# Patient Record
Sex: Female | Born: 1980 | Hispanic: No | Marital: Married | Smoking: Never smoker
Health system: Southern US, Community
[De-identification: ages and names within clinical notes are randomized; demographics above are authoritative.]

## PROBLEM LIST (undated history)

## (undated) DIAGNOSIS — D649 Anemia, unspecified: Secondary | ICD-10-CM

## (undated) HISTORY — DX: Anemia, unspecified: D64.9

## (undated) HISTORY — PX: NO PAST SURGERIES: SHX2092

---

## 2013-05-19 ENCOUNTER — Other Ambulatory Visit (HOSPITAL_COMMUNITY): Payer: Self-pay | Admitting: Physician Assistant

## 2013-05-19 DIAGNOSIS — Z3689 Encounter for other specified antenatal screening: Secondary | ICD-10-CM

## 2013-05-19 LAB — OB RESULTS CONSOLE HIV ANTIBODY (ROUTINE TESTING): HIV: NONREACTIVE

## 2013-05-19 LAB — OB RESULTS CONSOLE GC/CHLAMYDIA
CHLAMYDIA, DNA PROBE: NEGATIVE
Gonorrhea: NEGATIVE

## 2013-05-19 LAB — OB RESULTS CONSOLE RUBELLA ANTIBODY, IGM: RUBELLA: IMMUNE

## 2013-05-19 LAB — OB RESULTS CONSOLE ABO/RH: RH Type: POSITIVE

## 2013-05-19 LAB — OB RESULTS CONSOLE RPR: RPR: NONREACTIVE

## 2013-05-19 LAB — OB RESULTS CONSOLE HEPATITIS B SURFACE ANTIGEN: Hepatitis B Surface Ag: NEGATIVE

## 2013-05-19 LAB — OB RESULTS CONSOLE ANTIBODY SCREEN: Antibody Screen: NEGATIVE

## 2013-05-23 ENCOUNTER — Other Ambulatory Visit (HOSPITAL_COMMUNITY): Payer: Self-pay | Admitting: Physician Assistant

## 2013-05-23 ENCOUNTER — Ambulatory Visit (HOSPITAL_COMMUNITY)
Admission: RE | Admit: 2013-05-23 | Discharge: 2013-05-23 | Disposition: A | Discharge status: Home / Self Care | Payer: Medicaid Other | Source: Ambulatory Visit | Attending: Physician Assistant | Admitting: Physician Assistant

## 2013-05-23 ENCOUNTER — Encounter (HOSPITAL_COMMUNITY): Payer: Self-pay

## 2013-05-23 DIAGNOSIS — Z3689 Encounter for other specified antenatal screening: Secondary | ICD-10-CM

## 2013-07-24 NOTE — L&D Delivery Note (Signed)
Delivery Note At 5:56 AM a viable female was delivered via  (Presentation:OT ; ROA ).  APGAR: , ; weight .   Placenta status: heart shaped with accessory lobe, spontaneous, intact.  Cord: loose nuchal with the following complications: none .  Cord pH: not sent  Anesthesia:  Epidural Episiotomy: N/A Lacerations: 1st degree perineal Suture Repair: N/A Est. Blood Loss (mL): 400  Mom to postpartum.  Baby to Couplet care / Skin to Skin.  Myra RudeYacine Sheller was admitted for SOL.  She entered active labor without augmentation, and after cervix went complete and the head presented, followed by anterior shoulder and body. Nuchal cord reduced x1. Crying baby placed on mother's chest. Cord clamped x2 and cut by FOB. 1st degree laceration noted, hemostatic without repair. Uterine bleeding minimal and counts correct x2. [place] Katrinka BlazingSmith was present and supervised entire delivery. Continue routine post-partum care.   [name], Ginny Loomer C 10/16/2013, 6:22 AM

## 2013-07-24 NOTE — L&D Delivery Note (Signed)
I was present for the exam and agree with above.  [place] Had[place] Min[place], [place] 10/16/2013 8:32 AM

## 2013-09-18 LAB — OB RESULTS CONSOLE GBS: STREP GROUP B AG: POSITIVE

## 2013-10-14 ENCOUNTER — Other Ambulatory Visit (HOSPITAL_COMMUNITY): Payer: Self-pay | Admitting: Nurse Practitioner

## 2013-10-14 DIAGNOSIS — O480 Post-term pregnancy: Secondary | ICD-10-CM

## 2013-10-15 ENCOUNTER — Inpatient Hospital Stay (HOSPITAL_COMMUNITY)
Admission: AD | Admit: 2013-10-15 | Discharge: 2013-10-18 | DRG: 775 | Disposition: A | Discharge status: Home / Self Care | Payer: Medicaid Other | Source: Ambulatory Visit | Attending: Obstetrics & Gynecology | Admitting: Obstetrics & Gynecology

## 2013-10-15 ENCOUNTER — Other Ambulatory Visit (HOSPITAL_COMMUNITY): Payer: Self-pay

## 2013-10-15 ENCOUNTER — Encounter (HOSPITAL_COMMUNITY): Payer: Self-pay | Admitting: *Deleted

## 2013-10-15 ENCOUNTER — Telehealth (HOSPITAL_COMMUNITY): Payer: Self-pay | Admitting: *Deleted

## 2013-10-15 DIAGNOSIS — Z22330 Carrier of Group B streptococcus: Secondary | ICD-10-CM

## 2013-10-15 DIAGNOSIS — IMO0001 Reserved for inherently not codable concepts without codable children: Secondary | ICD-10-CM

## 2013-10-15 DIAGNOSIS — O9989 Other specified diseases and conditions complicating pregnancy, childbirth and the puerperium: Secondary | ICD-10-CM

## 2013-10-15 DIAGNOSIS — O99892 Other specified diseases and conditions complicating childbirth: Secondary | ICD-10-CM | POA: Diagnosis present

## 2013-10-15 MED ORDER — FENTANYL CITRATE 0.05 MG/ML IJ SOLN
100.0000 ug | INTRAMUSCULAR | Status: AC | PRN
  Administered 2013-10-16: 100 ug via INTRAVENOUS
  Filled 2013-10-15: qty 2

## 2013-10-15 NOTE — MAU Note (Signed)
Contractions  

## 2013-10-15 NOTE — Telephone Encounter (Signed)
Preadmission screen  

## 2013-10-16 ENCOUNTER — Inpatient Hospital Stay (HOSPITAL_COMMUNITY): Payer: Medicaid Other | Admitting: Anesthesiology

## 2013-10-16 ENCOUNTER — Encounter (HOSPITAL_COMMUNITY): Payer: Self-pay | Admitting: Obstetrics

## 2013-10-16 ENCOUNTER — Encounter (HOSPITAL_COMMUNITY): Payer: Medicaid Other | Admitting: Anesthesiology

## 2013-10-16 DIAGNOSIS — O99892 Other specified diseases and conditions complicating childbirth: Secondary | ICD-10-CM

## 2013-10-16 DIAGNOSIS — IMO0001 Reserved for inherently not codable concepts without codable children: Secondary | ICD-10-CM

## 2013-10-16 DIAGNOSIS — O9989 Other specified diseases and conditions complicating pregnancy, childbirth and the puerperium: Secondary | ICD-10-CM

## 2013-10-16 LAB — CBC
HCT: 39.4 % (ref 36.0–46.0)
Hemoglobin: 13.4 g/dL (ref 12.0–15.0)
MCH: 30.7 pg (ref 26.0–34.0)
MCHC: 34 g/dL (ref 30.0–36.0)
MCV: 90.2 fL (ref 78.0–100.0)
PLATELETS: 252 10*3/uL (ref 150–400)
RBC: 4.37 MIL/uL (ref 3.87–5.11)
RDW: 13.6 % (ref 11.5–15.5)
WBC: 10.4 10*3/uL (ref 4.0–10.5)

## 2013-10-16 LAB — RPR: RPR Ser Ql: NONREACTIVE

## 2013-10-16 MED ORDER — SENNOSIDES-DOCUSATE SODIUM 8.6-50 MG PO TABS
2.0000 | ORAL_TABLET | ORAL | Status: DC
  Administered 2013-10-16: 2 via ORAL
  Filled 2013-10-16 (×2): qty 2

## 2013-10-16 MED ORDER — ONDANSETRON HCL 4 MG/2ML IJ SOLN: 4.0000 mg | Freq: Four times a day (QID) | INTRAMUSCULAR | Status: DC | PRN

## 2013-10-16 MED ORDER — LACTATED RINGERS IV SOLN: 500.0000 mL | INTRAVENOUS | Status: DC | PRN

## 2013-10-16 MED ORDER — PRENATAL MULTIVITAMIN CH
1.0000 | ORAL_TABLET | Freq: Every day | ORAL | Status: DC
  Administered 2013-10-16 – 2013-10-18 (×3): 1 via ORAL
  Filled 2013-10-16 (×3): qty 1

## 2013-10-16 MED ORDER — DIPHENHYDRAMINE HCL 25 MG PO CAPS: 25.0000 mg | ORAL_CAPSULE | Freq: Four times a day (QID) | ORAL | Status: DC | PRN

## 2013-10-16 MED ORDER — LANOLIN HYDROUS EX OINT: TOPICAL_OINTMENT | CUTANEOUS | Status: DC | PRN

## 2013-10-16 MED ORDER — PHENYLEPHRINE 40 MCG/ML (10ML) SYRINGE FOR IV PUSH (FOR BLOOD PRESSURE SUPPORT)
80.0000 ug | PREFILLED_SYRINGE | INTRAVENOUS | Status: DC | PRN
  Filled 2013-10-16: qty 2

## 2013-10-16 MED ORDER — DIPHENHYDRAMINE HCL 50 MG/ML IJ SOLN: 12.5000 mg | INTRAMUSCULAR | Status: DC | PRN

## 2013-10-16 MED ORDER — ACETAMINOPHEN 325 MG PO TABS: 650.0000 mg | ORAL_TABLET | ORAL | Status: DC | PRN

## 2013-10-16 MED ORDER — EPHEDRINE 5 MG/ML INJ
10.0000 mg | INTRAVENOUS | Status: DC | PRN
  Filled 2013-10-16: qty 2
  Filled 2013-10-16: qty 4

## 2013-10-16 MED ORDER — IBUPROFEN 600 MG PO TABS
600.0000 mg | ORAL_TABLET | Freq: Four times a day (QID) | ORAL | Status: DC
  Administered 2013-10-16 – 2013-10-18 (×9): 600 mg via ORAL
  Filled 2013-10-16 (×9): qty 1

## 2013-10-16 MED ORDER — OXYTOCIN BOLUS FROM INFUSION
500.0000 mL | INTRAVENOUS | Status: DC
  Administered 2013-10-16: 500 mL via INTRAVENOUS

## 2013-10-16 MED ORDER — IBUPROFEN 600 MG PO TABS: 600.0000 mg | ORAL_TABLET | Freq: Four times a day (QID) | ORAL | Status: DC | PRN

## 2013-10-16 MED ORDER — OXYCODONE-ACETAMINOPHEN 5-325 MG PO TABS
1.0000 | ORAL_TABLET | ORAL | Status: DC | PRN
  Administered 2013-10-16: 1 via ORAL

## 2013-10-16 MED ORDER — PHENYLEPHRINE 40 MCG/ML (10ML) SYRINGE FOR IV PUSH (FOR BLOOD PRESSURE SUPPORT)
80.0000 ug | PREFILLED_SYRINGE | INTRAVENOUS | Status: DC | PRN
  Filled 2013-10-16: qty 2
  Filled 2013-10-16: qty 10

## 2013-10-16 MED ORDER — FENTANYL 2.5 MCG/ML BUPIVACAINE 1/10 % EPIDURAL INFUSION (WH - ANES)
14.0000 mL/h | INTRAMUSCULAR | Status: DC | PRN
  Administered 2013-10-16: 14 mL/h via EPIDURAL
  Filled 2013-10-16: qty 125

## 2013-10-16 MED ORDER — SIMETHICONE 80 MG PO CHEW: 80.0000 mg | CHEWABLE_TABLET | ORAL | Status: DC | PRN

## 2013-10-16 MED ORDER — LIDOCAINE HCL (PF) 1 % IJ SOLN
30.0000 mL | INTRAMUSCULAR | Status: DC | PRN
  Filled 2013-10-16: qty 30

## 2013-10-16 MED ORDER — OXYCODONE-ACETAMINOPHEN 5-325 MG PO TABS: 1.0000 | ORAL_TABLET | ORAL | Status: DC | PRN

## 2013-10-16 MED ORDER — FENTANYL 2.5 MCG/ML BUPIVACAINE 1/10 % EPIDURAL INFUSION (WH - ANES)
INTRAMUSCULAR | Status: DC | PRN
  Administered 2013-10-16: 14 mL/h via EPIDURAL

## 2013-10-16 MED ORDER — BENZOCAINE-MENTHOL 20-0.5 % EX AERO: 1.0000 "application " | INHALATION_SPRAY | CUTANEOUS | Status: DC | PRN

## 2013-10-16 MED ORDER — OXYTOCIN 40 UNITS IN LACTATED RINGERS INFUSION - SIMPLE MED
62.5000 mL/h | INTRAVENOUS | Status: DC
  Filled 2013-10-16: qty 1000

## 2013-10-16 MED ORDER — ONDANSETRON HCL 4 MG/2ML IJ SOLN: 4.0000 mg | INTRAMUSCULAR | Status: DC | PRN

## 2013-10-16 MED ORDER — SODIUM CHLORIDE 0.9 % IV SOLN
2.0000 g | Freq: Once | INTRAVENOUS | Status: DC
  Filled 2013-10-16: qty 2000

## 2013-10-16 MED ORDER — OXYTOCIN 40 UNITS IN LACTATED RINGERS INFUSION - SIMPLE MED: 62.5000 mL/h | INTRAVENOUS | Status: DC

## 2013-10-16 MED ORDER — ZOLPIDEM TARTRATE 5 MG PO TABS: 5.0000 mg | ORAL_TABLET | Freq: Every evening | ORAL | Status: DC | PRN

## 2013-10-16 MED ORDER — SODIUM CHLORIDE 0.9 % IV SOLN
2.0000 g | Freq: Once | INTRAVENOUS | Status: AC
  Administered 2013-10-16: 2 g via INTRAVENOUS
  Filled 2013-10-16: qty 2000

## 2013-10-16 MED ORDER — EPHEDRINE 5 MG/ML INJ
10.0000 mg | INTRAVENOUS | Status: DC | PRN
  Filled 2013-10-16: qty 2

## 2013-10-16 MED ORDER — OXYCODONE-ACETAMINOPHEN 5-325 MG PO TABS
1.0000 | ORAL_TABLET | ORAL | Status: DC | PRN
  Filled 2013-10-16: qty 1

## 2013-10-16 MED ORDER — LACTATED RINGERS IV SOLN
INTRAVENOUS | Status: DC
  Administered 2013-10-16 (×2): via INTRAVENOUS

## 2013-10-16 MED ORDER — LACTATED RINGERS IV SOLN: INTRAVENOUS | Status: DC

## 2013-10-16 MED ORDER — DIBUCAINE 1 % RE OINT: 1.0000 "application " | TOPICAL_OINTMENT | RECTAL | Status: DC | PRN

## 2013-10-16 MED ORDER — OXYTOCIN BOLUS FROM INFUSION: 500.0000 mL | INTRAVENOUS | Status: DC

## 2013-10-16 MED ORDER — TETANUS-DIPHTH-ACELL PERTUSSIS 5-2.5-18.5 LF-MCG/0.5 IM SUSP: 0.5000 mL | Freq: Once | INTRAMUSCULAR | Status: DC

## 2013-10-16 MED ORDER — ONDANSETRON HCL 4 MG PO TABS: 4.0000 mg | ORAL_TABLET | ORAL | Status: DC | PRN

## 2013-10-16 MED ORDER — CITRIC ACID-SODIUM CITRATE 334-500 MG/5ML PO SOLN: 30.0000 mL | ORAL | Status: DC | PRN

## 2013-10-16 MED ORDER — LIDOCAINE HCL (PF) 1 % IJ SOLN
INTRAMUSCULAR | Status: DC | PRN
  Administered 2013-10-16 (×3): 5 mL

## 2013-10-16 MED ORDER — FLEET ENEMA 7-19 GM/118ML RE ENEM: 1.0000 | ENEMA | RECTAL | Status: DC | PRN

## 2013-10-16 MED ORDER — LACTATED RINGERS IV SOLN
500.0000 mL | Freq: Once | INTRAVENOUS | Status: AC
  Administered 2013-10-16: 500 mL via INTRAVENOUS

## 2013-10-16 MED ORDER — WITCH HAZEL-GLYCERIN EX PADS: 1.0000 "application " | MEDICATED_PAD | CUTANEOUS | Status: DC | PRN

## 2013-10-16 NOTE — H&P (Signed)
Nichole Thomas is a 33 y.o. female presenting for SOL, checked yesterday and was "closed." Painful, regular contractions today, but no LOF or VB. +FM.   History 40.6 by 20 wk u/s. 1st delivery SVD 7lb7oz @ 40 weeks.  OB History   Grav Para Term Preterm Abortions TAB SAB Ect Mult Living   2 1 1       1      Past Medical History  Diagnosis Date  . Anemia    Past Surgical History  Procedure Laterality Date  . No past surgeries     Family History: family history is negative for Alcohol abuse, Arthritis, Asthma, Birth defects, Cancer, COPD, Depression, Diabetes, Drug abuse, Early death, Hearing loss, Heart disease, Hyperlipidemia, Hypertension, Kidney disease, Learning disabilities, Mental illness, Mental retardation, Miscarriages / Stillbirths, Stroke, Vision loss, and Varicose Veins. Social History:  reports that she has never smoked. She has never used smokeless tobacco. She reports that she does not drink alcohol or use illicit drugs.   Prenatal Transfer Tool  Maternal Diabetes: No Genetic Screening: Normal Maternal Ultrasounds/Referrals: Normal Fetal Ultrasounds or other Referrals:  None Maternal Substance Abuse:  No Significant Maternal Medications:  None Significant Maternal Lab Results:  Lab values include: Group B Strep positive Other Comments:  [facility] Patient  ROS See HPI  Dilation: 7.5 Effacement (%): 90 Station: -2 Exam by:: K Fraley   Blood pressure 103/65, pulse 93, temperature 97.7 F (36.5 C), temperature source Oral, resp. rate 18, height 5\' 7"  (1.702 m), weight 79.379 kg (175 lb), last menstrual period 01/03/2013, SpO2 100.00%. Maternal Exam:  Uterine Assessment: Contraction strength is firm.  Contraction frequency is regular.   Cervix: Cervix evaluated by digital exam.     Fetal Exam Fetal Monitor Review: Mode: ultrasound.   Baseline rate: 130.  Variability: moderate (6-25 bpm).   Pattern: accelerations present and no decelerations.     Fetal State Assessment: Category I - tracings are normal.     Physical Exam  Prenatal labs: ABO, Rh: A/Positive/-- (10/27 0000) Antibody: Negative (10/27 0000) Rubella: Immune (10/27 0000) RPR: Nonreactive (10/27 0000)  HBsAg: Negative (10/27 0000)  HIV: Non-reactive (10/27 0000)  GBS: Positive (02/26 0000)   Assessment/Plan: Nichole RudeYacine Slemmer is a 33 y.o. G2P1001 @ 526w6d presents for spontaneous onset of labor. Cervical changes in MAU admitted to L&D.    #Labor: Expectant management, will attempt to prevent precipitous delivery. EFW @20  wk in 37%. Glucola 116. Expect SVD.  #Pain: Fentanyl PRN #FWB: Category 1 in MAU #ID:  GBS +; ampicillin #Feeding: Breast and bottle #MOC: OCP  [name], Jonathan C 10/16/2013, 2:03 AM  I was present for the exam and agree with above.  FHR category I UC's Q 2-4, strong  [place] Heavyn Yearsley, [place] 10/16/2013 2:09 AM

## 2013-10-16 NOTE — Anesthesia Preprocedure Evaluation (Signed)

## 2013-10-16 NOTE — Progress Notes (Signed)
Nichole Thomas is a 33 y.o. G2P1001 at 3540w6d.  Subjective: Mild pressure w/ UC's  Objective: BP 131/82  Pulse 102  Temp(Src) 97.7 F (36.5 C) (Oral)  Resp 18  Ht 5\' 7"  (1.702 m)  Wt 79.379 kg (175 lb)  BMI 27.40 kg/m2  SpO2 100%  LMP 01/03/2013      FHT:  FHR: 120 bpm, variability: moderate,  accelerations:  Present,  decelerations:  Absent UC:   regular, every 2-4 minutes, strong SVE:   Dilation: 9.5 Effacement (%): 100 Station: 0 Exam by:: Larose KellsK Fraley RN  Labs: Lab Results  Component Value Date   WBC 10.4 10/16/2013   HGB 13.4 10/16/2013   HCT 39.4 10/16/2013   MCV 90.2 10/16/2013   PLT 252 10/16/2013    Assessment / Plan: Spontaneous labor, progressing normally  Labor: Progressing normally Preeclampsia:  NA Fetal Wellbeing:  Category I Pain Control:  Epidural I/D:  n/a Anticipated MOD:  NSVD  Akio Hudnall 10/16/2013, 4:16 AM

## 2013-10-16 NOTE — Anesthesia Postprocedure Evaluation (Signed)
  Anesthesia Post-op Note  Patient: Nichole Thomas  Procedure(s) Performed: * No procedures listed *  Patient Location: PACU and Mother/Baby  Anesthesia Type:Epidural  Level of Consciousness: awake, alert , oriented and patient cooperative  Airway and Oxygen Therapy: Patient Spontanous Breathing  Post-op Pain: mild  Post-op Assessment: Post-op Vital signs reviewed, Patient's Cardiovascular Status Stable, Respiratory Function Stable, Patent Airway, No signs of Nausea or vomiting, Adequate PO intake and Pain level controlled  Post-op Vital Signs: Reviewed and stable  Complications: No apparent anesthesia complications

## 2013-10-16 NOTE — Progress Notes (Signed)
Nichole Thomas is a 33 y.o. G2P1001 at 5929w6d by ultrasound admitted for active labor  Subjective: Pain well controlled. Still feeling ctx.   Objective: BP 103/65  Pulse 93  Temp(Src) 97.7 F (36.5 Thomas) (Oral)  Resp 18  Ht 5\' 7"  (1.702 m)  Wt 79.379 kg (175 lb)  BMI 27.40 kg/m2  SpO2 100%  LMP 01/03/2013      FHT:  FHR: 120 bpm, variability: moderate,  accelerations:  Present,  decelerations:  Absent UC:   regular, every 4 minutes SVE:   Dilation: 7.5 Effacement (%): 90 Station: -2 Exam by:: K Fraley   Labs: Lab Results  Component Value Date   WBC 10.4 10/16/2013   HGB 13.4 10/16/2013   HCT 39.4 10/16/2013   MCV 90.2 10/16/2013   PLT 252 10/16/2013    Assessment / Plan: Spontaneous labor, progressing normally  Labor: Progressing normally Ctx have slowed since receiving pain meds; making cervical change Preeclampsia:  no signs or symptoms of toxicity Fetal Wellbeing:  Category I Pain Control:  Epidural I/D:  GBS positive; received ampicillin Anticipated MOD:  NSVD  Nichole Thomas, Nichole Thomas 10/16/2013, 2:05 AM

## 2013-10-16 NOTE — Anesthesia Procedure Notes (Signed)
Epidural Patient location during procedure: OB  Staffing Anesthesiologist: Laderrick Wilk Performed by: anesthesiologist   Preanesthetic Checklist Completed: patient identified, site marked, surgical consent, pre-op evaluation, timeout performed, IV checked, risks and benefits discussed and monitors and equipment checked  Epidural Patient position: sitting Prep: ChloraPrep Patient monitoring: heart rate, continuous pulse ox and blood pressure Approach: right paramedian Location: L2-L3 Injection technique: LOR saline  Needle:  Needle type: Tuohy  Needle gauge: 17 G Needle length: 9 cm and 9 Needle insertion depth: 6 cm Catheter type: closed end flexible Catheter size: 20 Guage Catheter at skin depth: 11 cm Test dose: negative  Assessment Events: blood not aspirated, injection not painful, no injection resistance, negative IV test and no paresthesia  Additional Notes   Patient tolerated the insertion well without complications.   

## 2013-10-16 NOTE — Lactation Note (Signed)
This note was copied from the chart of Boy Leonor Holts. Lactation Consultation Note: Initial visit with this baby now 6 hours old. Mom reports that he did not nurse after delivery. She has him skin to skin. Ped in examining baby. He was fussy so we attempted to latch him on. As soon as he got next to her he went to sleep and would not latch.Mom easily able to hand express Colostrum. Spoon fed about 3 cc's to baby to see if he would awaken and latch. Still sleepy- placed skin to skin. Reviewed feeding cues with mom and encouraged to feed whenever she sees them. Mom reports that she plans to breast and formula feed. Encouraged to nurse frequently to promote a good milk supply. No questions at present. To call for assist prn.  Patient Name: Boy Myra RudeYacine Sawyer ZOXWR'UToday's Date: 10/16/2013 Reason for consult: Initial assessment   Maternal Data Formula Feeding for Exclusion: Yes Reason for exclusion: Mother's choice to formula and breast feed on admission Has patient been taught Hand Expression?: Yes Does the patient have breastfeeding experience prior to this delivery?: Yes  Feeding Feeding Type: Breast Fed  LATCH Score/Interventions Latch: Too sleepy or reluctant, no latch achieved, no sucking elicited.  Audible Swallowing: None  Type of Nipple: Everted at rest and after stimulation  Comfort (Breast/Nipple): Soft / non-tender     Hold (Positioning): Assistance needed to correctly position infant at breast and maintain latch. Intervention(s): Breastfeeding basics reviewed;Skin to skin;Support Pillows  LATCH Score: 5  Lactation Tools Discussed/Used     Consult Status Consult Status: Follow-up Date: 10/17/13 Follow-up type: In-patient    Pamelia HoitWeeks, Gable Odonohue D 10/16/2013, 12:16 PM

## 2013-10-17 ENCOUNTER — Ambulatory Visit (HOSPITAL_COMMUNITY): Admission: RE | Admit: 2013-10-17 | Payer: Self-pay | Source: Ambulatory Visit

## 2013-10-17 NOTE — Progress Notes (Signed)
Resident on call notified pt dizzy he stated pt ebl after delivery was 300 and she was not anemic prior to delivery instructed rn to have pt drink liter of fluid repeat b/p in couple of hours.

## 2013-10-17 NOTE — Progress Notes (Signed)
Post Partum Day 1  Subjective: no complaints, up ad lib, voiding, tolerating PO and + flatus  Objective: Blood pressure 95/64, pulse 85, temperature 98.5 F (36.9 C), temperature source Oral, resp. rate 16, height 5\' 7"  (1.702 m), weight 79.379 kg (175 lb), last menstrual period 01/03/2013, SpO2 100.00%, unknown if currently breastfeeding.  Physical Exam:  General: alert, cooperative and no distress Lochia: appropriate Uterine Fundus: firm Incision: N/A  DVT Evaluation: No evidence of DVT seen on physical exam. Negative Homan's sign. No cords or calf tenderness. No significant calf/ankle edema.   Recent Labs  10/16/13 0005  HGB 13.4  HCT 39.4    Assessment/Plan: Plan for discharge tomorrow and Social Work consult for transportation issues if son remains in NICU   LOS: 2 days   [name], Jonathan C 10/17/2013, 9:30 AM   I have seen and examined this patient and agree with above documentation in the resident's note. Baby in NICU due to hypothermia and tachypnea. Being monitored.    Rulon AbideKeli Gustavia Carie, M.D. [facility] 10/17/2013 10:41 AM

## 2013-10-17 NOTE — Lactation Note (Addendum)
This note was copied from the chart of Nichole Thomas. Lactation Consultation Note   Follow up  consult with this mom of a NICU baby, now 30 hours post partum, and in the NICU for persistent tachypnea and low temperatures. Mom has begun pumping with a DEP, and I reviewed the importance of pumping , in premie setting, every 3 hours, 8 times a day. Mom is also breast feeding her baby in the nICU. She was able to express 2-3 mls of colostrum with DEP, while I was with her, and I showed her how to add hand expression. Mom is active with WIC, and will be discharged to home tomorrow. [facility] for them to set up a time for an appointment to pick up a pump. I will follow this family in the niCU.  Patient Name: Nichole Thomas [id number]'XToday's Date: 10/17/2013 Reason for consult: Initial assessment;NICU baby   Maternal Data Formula Feeding for Exclusion: Yes (baby in NICU) Has patient been taught Hand Expression?: Yes Does the patient have breastfeeding experience prior to this delivery?: Yes  Feeding Feeding Type: Formula Length of feed: 30 min  LATCH Score/Interventions Latch: Repeated attempts needed to sustain latch, nipple held in mouth throughout feeding, stimulation needed to elicit sucking reflex. Intervention(s): Skin to skin Intervention(s): Assist with latch;Adjust position              Intervention(s): Skin to skin;Support Pillows     Lactation Tools Discussed/Used Tools: Pump Breast pump type: Double-Electric Breast Pump WIC Program: Yes ([facility]) Pump Review: Setup, frequency, and cleaning;Milk Storage;Other (comment) (premie setting, hand expression, and review of book on ow to provide eBm fr your baby) Initiated by:: bedside RN Date initiated:: 10/17/13   Consult Status Consult Status: Follow-up Date: 10/18/13 Follow-up type: In-patient    Alfred LevinsLee, Karl Erway Anne 10/17/2013, 2:10 PM

## 2013-10-17 NOTE — Progress Notes (Signed)
Pt states she is no longer dizzy like at 1530. Her B/P was lower at 1800 than at 1530. She is eating normally- good amounts.

## 2013-10-17 NOTE — Progress Notes (Signed)
Clinical Social Work Department PSYCHOSOCIAL ASSESSMENT - MATERNAL/CHILD 10/17/2013  Patient:  Nichole Thomas,Nichole Thomas  Account Number:  [id number]  Admit Date:  10/15/2013  Ardine Eng Name:   Merrilyn Puma    Clinical Social Worker:  Terri Piedra, LCSW   Date/Time:  10/17/2013 10:00 AM  Date Referred:  10/17/2013   Referral source  Physician     Referred reason  Transportation assistance  NICU   Other referral source:    I:  FAMILY / HOME ENVIRONMENT Child's legal guardian:  PARENT  Guardian - Name Guardian - Age Guardian - Address  Nichole Thomas [address], [place], [place] [address]  Nichole Thomas  same   Other household support members/support persons Name Relationship DOB   SON 2   Other support:   MOB states her husband's cousin is a support person here in [place] and is currently taking care of their 33 year old while MOB is in the hospital and FOB is at work.    II  PSYCHOSOCIAL DATA Information Source:  Patient Interview  Occupational hygienist Employment:   FOB works for [employer] resources:   If Medicaid - County:    School / Grade:   Maternity Care Coordinator / Child Services Coordination / Early Interventions:  Cultural issues impacting care:   MOB is originally from [place] and has lived in the [place] for 9 months.  She speaks [place], however, there is a definite language barrier at times.  Her primary language is [place].    III  STRENGTHS Strengths  Adequate Resources  Compliance with medical plan  Home prepared for Child (including basic supplies)  Other - See comment  Supportive family/friends  Understanding of illness   Strength comment:  Baby will go to [facility] for pediatric follow up.   IV  RISK FACTORS AND CURRENT PROBLEMS Current Problem:  None   Risk Factor & Current Problem Patient Issue Family Issue Risk Factor / Current Problem Comment         V  SOCIAL WORK ASSESSMENT  CSW met with MOB in her first  floor room/122 to introduce myself and complete assessment due to baby's admission to NICU and consult for "transportation needs."  MOB was very pleasant and welcoming of CSW's visit.  She states she is doing well and has no questions or needs at this time.  She states she speaks [place], but asked CSW to speak clearly and slowly.  She states her primary language is [place].  CSW informed her that she can request a [place] interpreter at any time if she wishes.  She stated understanding.  She reports having everything she needs for baby at home and a good, however limited, support system.  CSW asked her about transportation in order to visit the baby after her discharge and she states she can come up when her husband gets off work at 5 each day (Monday-Friday), but will not be able to come every three hours to breastfeed the baby.  CSW explained that we do not expect any NICU mother to be able to do this and asked if she has [facility] or a pump at home.  She states she has [place].  CSW explained that she can get a pump from [facility] and bring her pumped milk to the NICU each time she is able to come see her baby.  She reports FOB has a car and they will be able to come once per day on week days.  Therefore,  MOB does not have transportation needs.  CSW did offer bus passes, but MOB thankfully declined this resource.  MOB states no questions about baby's current medical needs.  She states she may choose to stay her second night in the hospital any way to be close to the baby, instead of being an early discharge today.  CSW told her to let her RN know what she decides.  CSW discussed signs and symptoms of PPD to watch for and asked her to let CSW or her doctor know if she has emotional concerns at any time.  She agreed and denies PPD symptoms after her first child.  CSW is not aware of any social concerns at this time and identifies no barriers to discharge.  CSW explained ongoing support services offered by NICU CSW and gave MOB  CSW's contact information.   VI SOCIAL WORK PLAN Social Work Plan  Psychosocial Support/Ongoing Assessment of Needs  Patient/Family Education   Type of pt/family education:   Ongoing support services offered by NICU CSW  PPD signs and symptoms   If child protective services report - county:   If child protective services report - date:   Information/referral to community resources comment:   No referral needs identified at this time.   Other social work plan:

## 2013-10-17 NOTE — Lactation Note (Signed)
This note was copied from the chart of Nichole Thomas. Lactation Consultation Note  Patient Name: Nichole Thomas [id number] Date: 10/17/2013 Reason for consult: Follow-up assessment;Other (Comment) (update of exclusion due to baby's transfer to NICU) Mom is almost asleep and has just returned from a visit to baby in NICU.  Mom has hand pump in room but LC recommends mom use DEBP.  LC placed symphony pump and kit at bedside and asked RN to assist her with use of pump later tonight.   Maternal Data Reason for exclusion: Admission to Intensive Care Unit (ICU) post-partum  Feeding Feeding Type: Breast Milk with Formula added Length of feed: 30 min  LATCH Score/Interventions              baby in NICU        Lactation Tools Discussed/Used Tools: Pump Pump Review:  (pump placed in room but mom resting after visit to NICU; RN to assist later) Date initiated:: 10/17/13   Consult Status Consult Status: Follow-up Date: 10/17/13 Follow-up type: In-patient    Junious Dresser [facility] 10/17/2013, 2:56 AM

## 2013-10-17 NOTE — Progress Notes (Signed)
UR chart review completed.  

## 2013-10-17 NOTE — Progress Notes (Signed)
Mother using electric pump and taking milk to NICU

## 2013-10-17 NOTE — Progress Notes (Signed)
Mother was offerred early discharge by [facility] tonight and she wants to go. She will need to get [facility] loaner pump in order to keep breast milk available for infant. There is paper work for this that mother may need help filling out tomorrow. I instructed her in taking the pump kit home in order to use the motor.

## 2013-10-18 MED ORDER — DOCUSATE SODIUM 100 MG PO CAPS: 100.0000 mg | ORAL_CAPSULE | Freq: Two times a day (BID) | ORAL | Status: AC

## 2013-10-18 MED ORDER — IBUPROFEN 600 MG PO TABS: 600.0000 mg | ORAL_TABLET | Freq: Four times a day (QID) | ORAL | Status: AC | PRN

## 2013-10-18 NOTE — Discharge Summary (Signed)
Obstetric Discharge Summary Reason for Admission: onset of labor Prenatal Procedures: none Intrapartum Procedures: spontaneous vaginal delivery and GBS prophylaxis Postpartum Procedures: none Complications-Operative and Postpartum: 1st degree perineal laceration Hemoglobin  Date Value Ref Range Status  10/16/2013 13.4  12.0 - 15.0 g/dL Final     HCT  Date Value Ref Range Status  10/16/2013 39.4  36.0 - 46.0 % Final    Discharge Diagnoses: Term Pregnancy-delivered  Hospital Course:  Nichole Thomas is a 33 y.o. [id number] who presented with active labor.  She had a uncomplicated SVD. She was able to ambulate, tolerate PO and void normally. She was discharged home with instructions for postpartum care.    Delivery Note  At 5:56 AM a viable female was delivered via (Presentation:OT ; ROA ). APGAR: , ; weight .  Placenta status: heart shaped with accessory lobe, spontaneous, intact. Cord: loose nuchal with the following complications: none . Cord pH: not sent  Anesthesia: Epidural  Episiotomy: N/A  Lacerations: 1st degree perineal  Suture Repair: N/A  Est. Blood Loss (mL): 400  Mom to postpartum. Baby to Couplet care / Skin to Skin.  Nichole Thomas was admitted for SOL. She entered active labor without augmentation, and after cervix went complete and the head presented, followed by anterior shoulder and body. Nuchal cord reduced x1. Crying baby placed on mother's chest. Cord clamped x2 and cut by FOB. 1st degree laceration noted, hemostatic without repair. Uterine bleeding minimal and counts correct x2. Nichole Thomas was present and supervised entire delivery. Continue routine post-partum care    Physical Exam:  General: alert, cooperative and no distress Lochia: appropriate Uterine Fundus: firm DVT Evaluation: No evidence of DVT seen on physical exam. Negative Homan's sign. No cords or calf tenderness. No significant calf/ankle edema.  Discharge Information: Date: 10/18/2013 Activity:  pelvic rest Diet: routine Medications: PNV, Ibuprofen and Colace Baby feeding: plans to breastfeed, plans to bottle feed, pumping while baby in nicu Contraception: Nexplanon, motrin Condition: stable Instructions: refer to practice specific booklet Discharge to: home Follow-up Information   [facility]. (Discusss contraception options)    Specialty:  Home Health Services   Contact information:   Care Coordination for [facility] [address] [place] [id number] [phone]       Newborn Data: Live born female  Birth Weight: 7 lb 7 oz (3374 g) APGAR: 8, 9  Baby to stay in hospital until discharge by pediatrics.  Wenda LowJames Joyner, MD [facility] PGY-1 10/18/2013, 9:34 AM

## 2013-10-18 NOTE — Discharge Summary (Signed)
Attestation of Attending Supervision of Obstetric Fellow: Evaluation and management procedures were performed by the Obstetric Fellow under my supervision and collaboration.  I have reviewed the Obstetric Fellow's note and chart, and I agree with the management and plan.  Aixa Corsello, MD, FACOG Attending Obstetrician & Gynecologist Faculty Practice, [facility]   

## 2013-10-18 NOTE — Discharge Instructions (Signed)
Postpartum Care After Vaginal Delivery °After you deliver your newborn (postpartum period), the usual stay in the hospital is 24 72 hours. If there were problems with your labor or delivery, or if you have other medical problems, you might be in the hospital longer.  °While you are in the hospital, you will receive help and instructions on how to care for yourself and your newborn during the postpartum period.  °While you are in the hospital: °· Be sure to tell your nurses if you have pain or discomfort, as well as where you feel the pain and what makes the pain worse. °· If you had an incision made near your vagina (episiotomy) or if you had some tearing during delivery, the nurses may put ice packs on your episiotomy or tear. The ice packs may help to reduce the pain and swelling. °· If you are breastfeeding, you may feel uncomfortable contractions of your uterus for a couple of weeks. This is normal. The contractions help your uterus get back to normal size. °· It is normal to have some bleeding after delivery. °· For the first 1 3 days after delivery, the flow is red and the amount may be similar to a period. °· It is common for the flow to start and stop. °· In the first few days, you may pass some small clots. Let your nurses know if you begin to pass large clots or your flow increases. °· Do not  flush blood clots down the toilet before having the nurse look at them. °· During the next 3 10 days after delivery, your flow should become more watery and pink or brown-tinged in color. °· Ten to fourteen days after delivery, your flow should be a small amount of yellowish-white discharge. °· The amount of your flow will decrease over the first few weeks after delivery. Your flow may stop in 6 8 weeks. Most women have had their flow stop by 12 weeks after delivery. °· You should change your sanitary pads frequently. °· Wash your hands thoroughly with soap and water for at least 20 seconds after changing pads, using  the toilet, or before holding or feeding your newborn. °· You should feel like you need to empty your bladder within the first 6 8 hours after delivery. °· In case you become weak, lightheaded, or faint, call your nurse before you get out of bed for the first time and before you take a shower for the first time. °· Within the first few days after delivery, your breasts may begin to feel tender and full. This is called engorgement. Breast tenderness usually goes away within 48 72 hours after engorgement occurs. You may also notice milk leaking from your breasts. If you are not breastfeeding, do not stimulate your breasts. Breast stimulation can make your breasts produce more milk. °· Spending as much time as possible with your newborn is very important. During this time, you and your newborn can feel close and get to know each other. Having your newborn stay in your room (rooming in) will help to strengthen the bond with your newborn.  It will give you time to get to know your newborn and become comfortable caring for your newborn. °· Your hormones change after delivery. Sometimes the hormone changes can temporarily cause you to feel sad or tearful. These feelings should not last more than a few days. If these feelings last longer than that, you should talk to your caregiver. °· If desired, talk to your caregiver about   methods of family planning or contraception.  Talk to your caregiver about immunizations. Your caregiver may want you to have the following immunizations before leaving the hospital:  Tetanus, diphtheria, and pertussis (Tdap) or tetanus and diphtheria (Td) immunization. It is very important that you and your family (including grandparents) or others caring for your newborn are up-to-date with the Tdap or Td immunizations. The Tdap or Td immunization can help protect your newborn from getting ill.  Rubella immunization.  Varicella (chickenpox) immunization.  Influenza immunization. You should  receive this annual immunization if you did not receive the immunization during your pregnancy. Document Released: 05/07/2007 Document Revised: 04/03/2012 Document Reviewed: 03/06/2012 [facility] Patient Information 2014 [place], [place].   Etonogestrel implant What is this medicine? ETONOGESTREL (et oh noe JES trel) is a contraceptive (birth control) device. It is used to prevent pregnancy. It can be used for up to 3 years. This medicine may be used for other purposes; ask your health care provider or pharmacist if you have questions. COMMON BRAND NAME(S): Implanon, Nexplanon  What should I tell my health care provider before I take this medicine? They need to know if you have any of these conditions: -abnormal vaginal bleeding -blood vessel disease or blood clots -cancer of the breast, cervix, or liver -depression -diabetes -gallbladder disease -headaches -heart disease or recent heart attack -high blood pressure -high cholesterol -kidney disease -liver disease -renal disease -seizures -tobacco smoker -an unusual or allergic reaction to etonogestrel, other hormones, anesthetics or antiseptics, medicines, foods, dyes, or preservatives -pregnant or trying to get pregnant -breast-feeding How should I use this medicine? This device is inserted just under the skin on the inner side of your upper arm by a health care professional. Talk to your pediatrician regarding the use of this medicine in children. Special care may be needed. Overdosage: If you think you've taken too much of this medicine contact a poison control center or emergency room at once. Overdosage: If you think you have taken too much of this medicine contact a poison control center or emergency room at once. NOTE: This medicine is only for you. Do not share this medicine with others. What if I miss a dose? This does not apply. What may interact with this medicine? Do not take this medicine with any of the following  medications: -amprenavir -bosentan -fosamprenavir This medicine may also interact with the following medications: -barbiturate medicines for inducing sleep or treating seizures -certain medicines for fungal infections like ketoconazole and itraconazole -griseofulvin -medicines to treat seizures like carbamazepine, felbamate, oxcarbazepine, phenytoin, topiramate -modafinil -phenylbutazone -rifampin -some medicines to treat HIV infection like atazanavir, indinavir, lopinavir, nelfinavir, tipranavir, ritonavir -St. John's wort This list may not describe all possible interactions. Give your health care provider a list of all the medicines, herbs, non-prescription drugs, or dietary supplements you use. Also tell them if you smoke, drink alcohol, or use illegal drugs. Some items may interact with your medicine. What should I watch for while using this medicine? This product does not protect you against HIV infection (AIDS) or other sexually transmitted diseases. You should be able to feel the implant by pressing your fingertips over the skin where it was inserted. Tell your doctor if you cannot feel the implant. What side effects may I notice from receiving this medicine? Side effects that you should report to your doctor or health care professional as soon as possible: -allergic reactions like skin rash, itching or hives, swelling of the face, lips, or tongue -breast lumps -changes in  vision -confusion, trouble speaking or understanding -dark urine -depressed mood -general ill feeling or flu-like symptoms -light-colored stools -loss of appetite, nausea -right upper belly pain -severe headaches -severe pain, swelling, or tenderness in the abdomen -shortness of breath, chest pain, swelling in a leg -signs of pregnancy -sudden numbness or weakness of the face, arm or leg -trouble walking, dizziness, loss of balance or coordination -unusual vaginal bleeding, discharge -unusually weak or  tired -yellowing of the eyes or skin Side effects that usually do not require medical attention (Report these to your doctor or health care professional if they continue or are bothersome.): -acne -breast pain -changes in weight -cough -fever or chills -headache -irregular menstrual bleeding -itching, burning, and vaginal discharge -pain or difficulty passing urine -sore throat This list may not describe all possible side effects. Call your doctor for medical advice about side effects. You may report side effects to FDA at 1-800-FDA-1088. Where should I keep my medicine? This drug is given in a hospital or clinic and will not be stored at home. NOTE: This sheet is a summary. It may not cover all possible information. If you have questions about this medicine, talk to your doctor, pharmacist, or health care provider.  2014, [employer] Standard. (2012-01-15 15:37:45) Levonorgestrel intrauterine device (IUD) What is this medicine? LEVONORGESTREL IUD (LEE voe nor jes trel) is a contraceptive (birth control) device. The device is placed inside the uterus by a healthcare professional. It is used to prevent pregnancy and can also be used to treat heavy bleeding that occurs during your period. Depending on the device, it can be used for 3 to 5 years. This medicine may be used for other purposes; ask your health care provider or pharmacist if you have questions. COMMON BRAND NAME(S): Gretta Cool What should I tell my health care provider before I take this medicine? They need to know if you have any of these conditions: -abnormal Pap smear -cancer of the breast, uterus, or cervix -diabetes -endometritis -genital or pelvic infection now or in the past -have more than one sexual partner or your partner has more than one partner -heart disease -history of an ectopic or tubal pregnancy -immune system problems -IUD in place -liver disease or tumor -problems with blood clots or take  blood-thinners -use intravenous drugs -uterus of unusual shape -vaginal bleeding that has not been explained -an unusual or allergic reaction to levonorgestrel, other hormones, silicone, or polyethylene, medicines, foods, dyes, or preservatives -pregnant or trying to get pregnant -breast-feeding How should I use this medicine? This device is placed inside the uterus by a health care professional. Talk to your pediatrician regarding the use of this medicine in children. Special care may be needed. Overdosage: If you think you have taken too much of this medicine contact a poison control center or emergency room at once. NOTE: This medicine is only for you. Do not share this medicine with others. What if I miss a dose? This does not apply. What may interact with this medicine? Do not take this medicine with any of the following medications: -amprenavir -bosentan -fosamprenavir This medicine may also interact with the following medications: -aprepitant -barbiturate medicines for inducing sleep or treating seizures -bexarotene -griseofulvin -medicines to treat seizures like carbamazepine, ethotoin, felbamate, oxcarbazepine, phenytoin, topiramate -modafinil -pioglitazone -rifabutin -rifampin -rifapentine -some medicines to treat HIV infection like atazanavir, indinavir, lopinavir, nelfinavir, tipranavir, ritonavir -St. John's wort -warfarin This list may not describe all possible interactions. Give your health care provider a list of all the  medicines, herbs, non-prescription drugs, or dietary supplements you use. Also tell them if you smoke, drink alcohol, or use illegal drugs. Some items may interact with your medicine. What should I watch for while using this medicine? Visit your doctor or health care professional for regular check ups. See your doctor if you or your partner has sexual contact with others, becomes HIV positive, or gets a sexual transmitted disease. This product does  not protect you against HIV infection (AIDS) or other sexually transmitted diseases. You can check the placement of the IUD yourself by reaching up to the top of your vagina with clean fingers to feel the threads. Do not pull on the threads. It is a good habit to check placement after each menstrual period. Call your doctor right away if you feel more of the IUD than just the threads or if you cannot feel the threads at all. The IUD may come out by itself. You may become pregnant if the device comes out. If you notice that the IUD has come out use a backup birth control method like condoms and call your health care provider. Using tampons will not change the position of the IUD and are okay to use during your period. What side effects may I notice from receiving this medicine? Side effects that you should report to your doctor or health care professional as soon as possible: -allergic reactions like skin rash, itching or hives, swelling of the face, lips, or tongue -fever, flu-like symptoms -genital sores -high blood pressure -no menstrual period for 6 weeks during use -pain, swelling, warmth in the leg -pelvic pain or tenderness -severe or sudden headache -signs of pregnancy -stomach cramping -sudden shortness of breath -trouble with balance, talking, or walking -unusual vaginal bleeding, discharge -yellowing of the eyes or skin Side effects that usually do not require medical attention (report to your doctor or health care professional if they continue or are bothersome): -acne -breast pain -change in sex drive or performance -changes in weight -cramping, dizziness, or faintness while the device is being inserted -headache -irregular menstrual bleeding within first 3 to 6 months of use -nausea This list may not describe all possible side effects. Call your doctor for medical advice about side effects. You may report side effects to FDA at 1-800-FDA-1088. Where should I keep my  medicine? This does not apply. NOTE: This sheet is a summary. It may not cover all possible information. If you have questions about this medicine, talk to your doctor, pharmacist, or health care provider.  2014, [employer]/Gold Standard. (2011-08-10 13:54:04)

## 2013-10-18 NOTE — Progress Notes (Signed)
Pt states she isn't dizzy now but does get dizzy occasionally- unrelated to any event. She stated she was anemic early in her pregnancy.

## 2013-10-20 ENCOUNTER — Inpatient Hospital Stay (HOSPITAL_COMMUNITY): Admission: RE | Admit: 2013-10-20 | Payer: Medicaid Other | Source: Ambulatory Visit

## 2014-05-25 ENCOUNTER — Encounter (HOSPITAL_COMMUNITY): Payer: Self-pay | Admitting: Obstetrics

## 2014-06-22 IMAGING — US US OB COMP +14 WK
1 series · 12 of 28 positions shown · non-contrast
Comparison: none

[Series 1: us ob comp +14 wk · 12 of 93 slices shown]
[im 4/93]
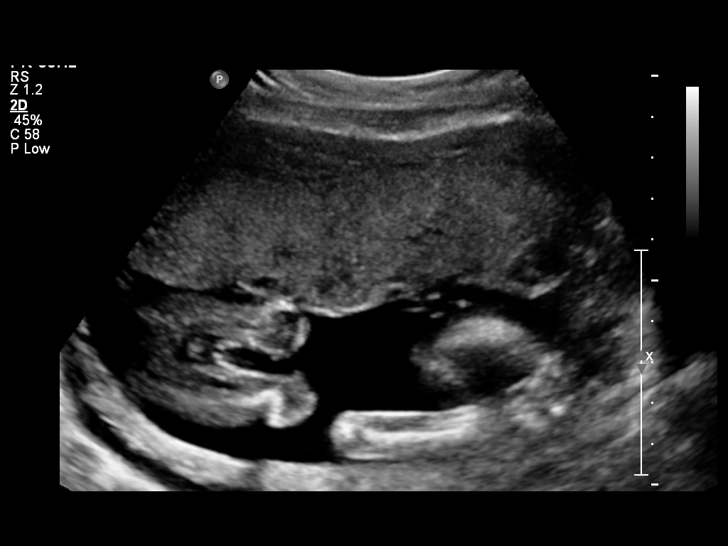
[im 11/93]
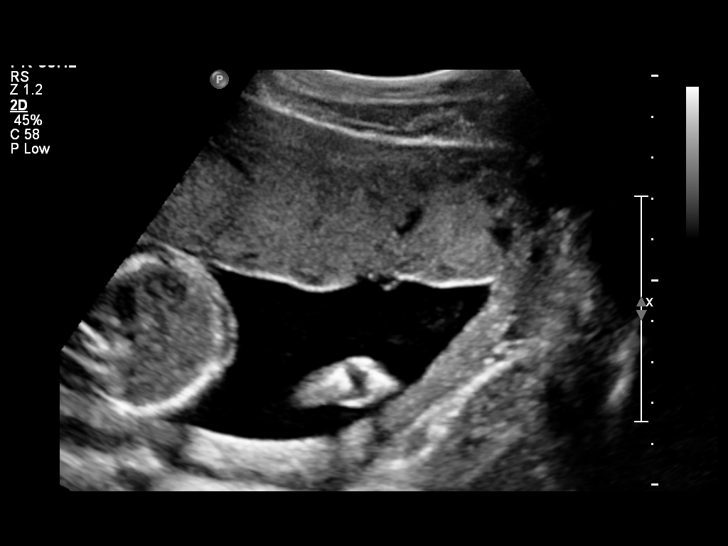
[im 18/93]
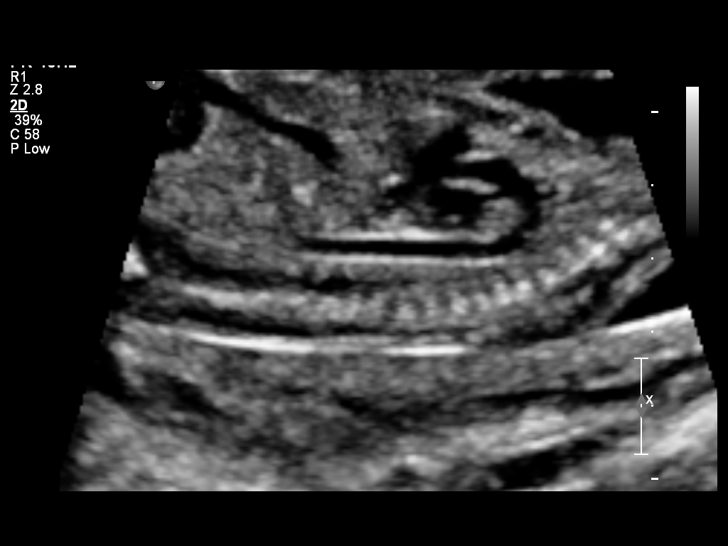
[im 28/93]
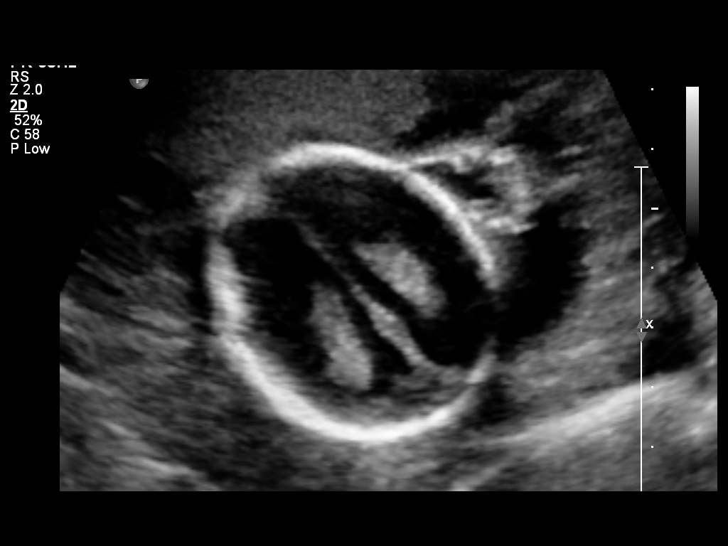
[im 35/93]
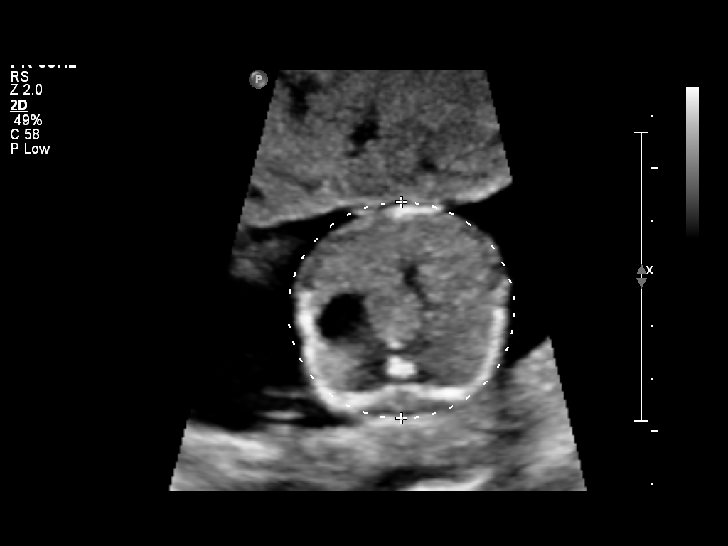
[im 41/93]
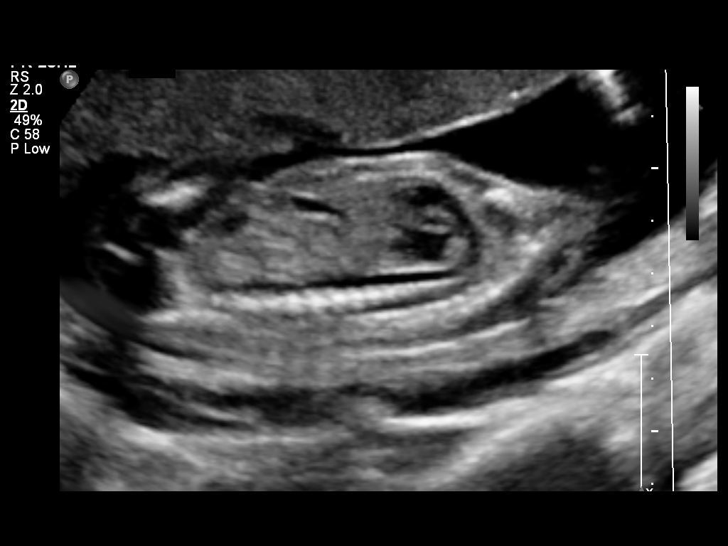
[im 52/93]
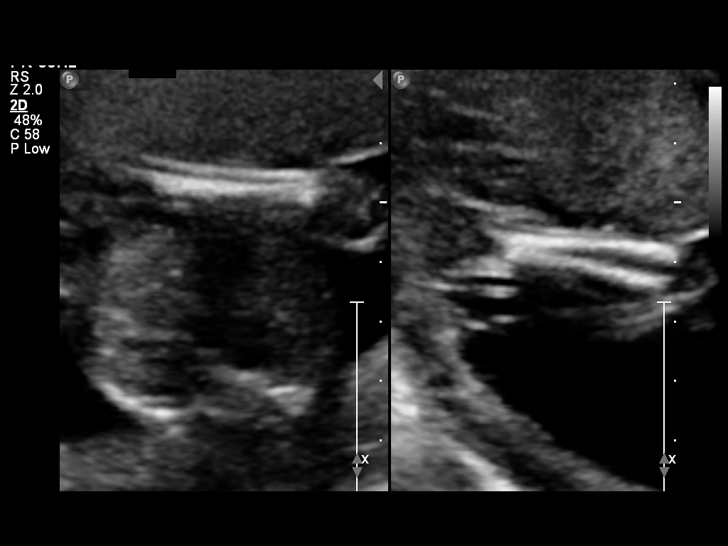
[im 58/93]
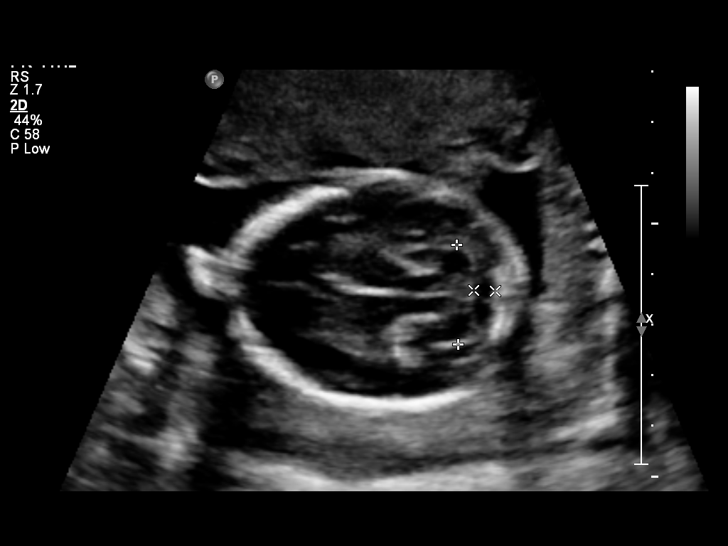
[im 65/93]
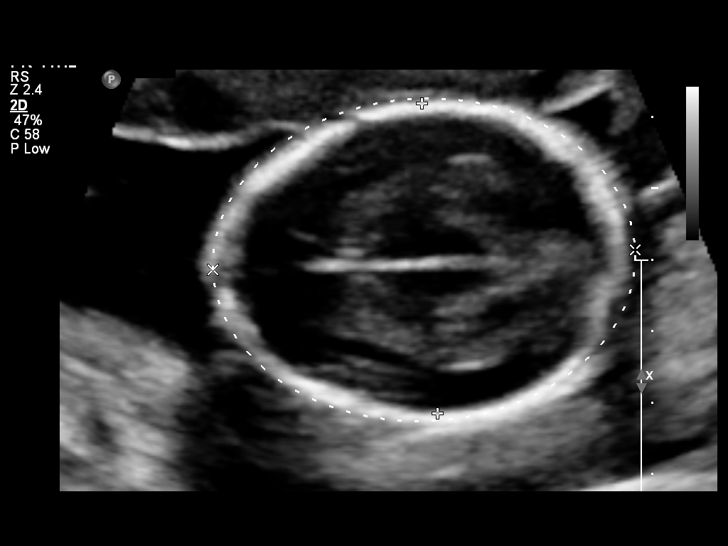
[im 75/93]
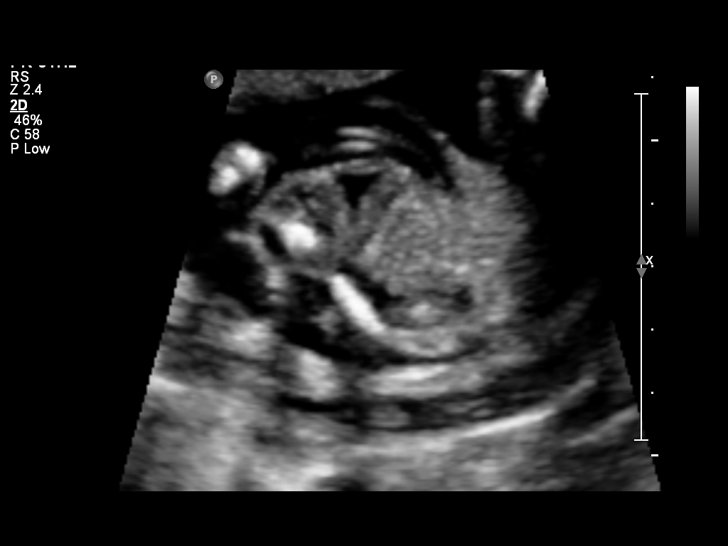
[im 82/93]
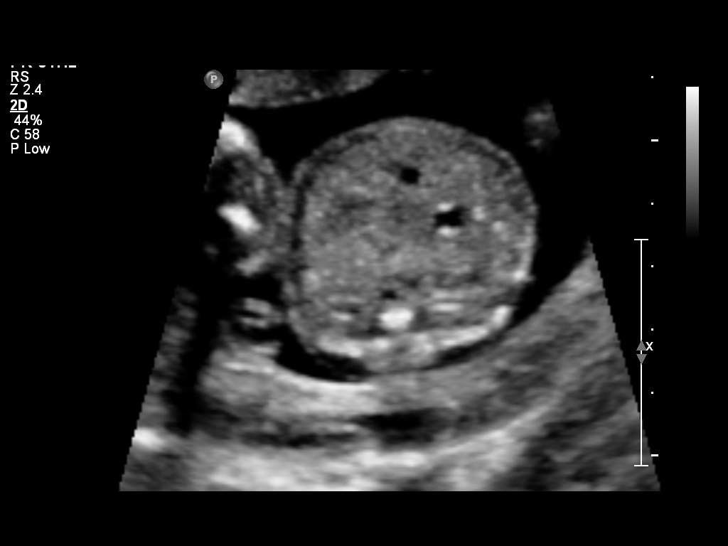
[im 89/93]
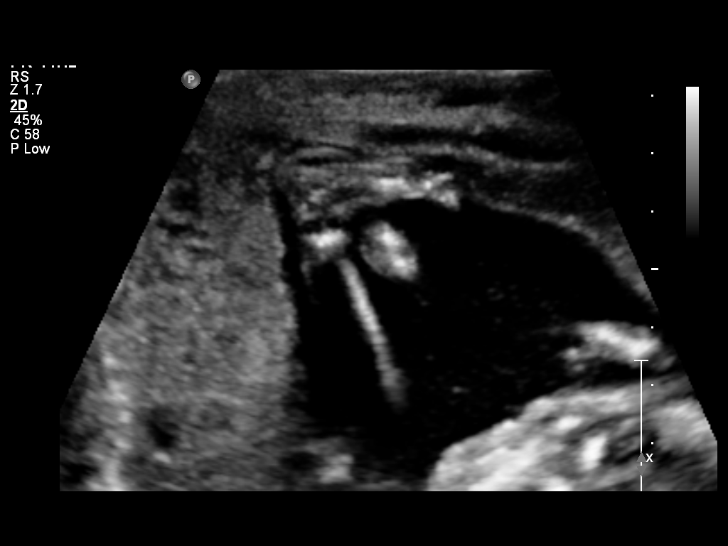

[12 of 28 positions shown; findings below may reference images not displayed]

OBSTETRICS REPORT
                      (Signed Final 05/23/2013 [DATE])

Service(s) Provided

 US OB COMP + 14 WK                                    76805.1
Indications

 Basic anatomic survey
Fetal Evaluation

 Num Of Fetuses:    1
 Fetal Heart Rate:  136                          bpm
 Cardiac Activity:  Observed
 Presentation:      Cephalic
 Placenta:          Anterior, above cervical os
 P. Cord            Visualized, central
 Insertion:

 Amniotic Fluid
 AFI FV:      Subjectively within normal limits
                                             Larg Pckt:    4.15  cm
Biometry

 BPD:     43.3  mm     G. Age:  19w 1d                CI:        69.71   70 - 86
                                                      FL/HC:      19.6   16.8 -

 HC:     165.5  mm     G. Age:  19w 2d       14  %    HC/AC:      1.25   1.09 -

 AC:     132.6  mm     G. Age:  18w 5d       11  %    FL/BPD:
 FL:      32.4  mm     G. Age:  20w 1d       46  %    FL/AC:      24.4   20 - 24
 HUM:     31.7  mm     G. Age:  20w 4d       69  %
 CER:     19.7  mm     G. Age:  18w 6d       21  %

 Est. FW:     289  gm    0 lb 10 oz      37  %
Gestational Age

 LMP:           20w 0d        Date:  01/03/13                 EDD:   10/10/13
 U/S Today:     19w 2d                                        EDD:   10/15/13
 Best:          20w 0d     Det. By:  LMP  (01/03/13)          EDD:   10/10/13
Anatomy

 Cranium:          Appears normal         Aortic Arch:      Appears normal
 Fetal Cavum:      Appears normal         Ductal Arch:      Appears normal
 Ventricles:       Appears normal         Diaphragm:        Appears normal
 Choroid Plexus:   Appears normal         Stomach:          Appears normal
 Cerebellum:       Appears normal         Abdomen:          Appears normal
 Posterior Fossa:  Appears normal         Abdominal Wall:   Appears nml (cord
                                                            insert, abd wall)
 Nuchal Fold:      Appears normal         Cord Vessels:     Appears normal (3
                                                            vessel cord)
 Face:             Appears normal         Kidneys:          Appear normal
                   (orbits and profile)
 Lips:             Appears normal         Bladder:          Appears normal
 Heart:            Appears normal         Spine:            Appears normal
                   (4CH, axis, and
                   situs)
 RVOT:             Appears normal         Lower             Appears normal
                                          Extremities:
 LVOT:             Appears normal         Upper             Appears normal
                                          Extremities:

 Other:  Male gender. Heels and 5th digit visualized. Technically difficult due
         to fetal position.
Targeted Anatomy

 Fetal Central Nervous System
 Cisterna Magna:
Cervix Uterus Adnexa

 Cervical Length:    3.47     cm

 Cervix:       Normal appearance by transabdominal scan.
 Uterus:       No abnormality visualized.
 Cul De Sac:   No free fluid seen.

 Left Ovary:    Within normal limits.
 Right Ovary:   Within normal limits.
 Adnexa:     No abnormality visualized.
Impression

 Active SIUP at 40w0d
 EFW 37th percentile
 No dysmorphic features
 AFV is gestational age appropriate
Recommendations

 Follow up ultrasounds as clinically indicated.

 questions or concerns.
